# Patient Record
Sex: Male | Born: 1990 | Race: White | Hispanic: No | Marital: Married | Smoking: Never smoker
Health system: Southern US, Community
[De-identification: ages and names within clinical notes are randomized; demographics above are authoritative.]

---

## 2014-08-25 ENCOUNTER — Other Ambulatory Visit: Payer: Self-pay | Admitting: Occupational Medicine

## 2014-08-25 ENCOUNTER — Ambulatory Visit: Payer: Self-pay

## 2014-08-25 DIAGNOSIS — M79642 Pain in left hand: Secondary | ICD-10-CM

## 2016-01-14 ENCOUNTER — Encounter (HOSPITAL_COMMUNITY): Payer: Self-pay | Admitting: *Deleted

## 2016-01-14 ENCOUNTER — Emergency Department (HOSPITAL_COMMUNITY)
Admission: EM | Admit: 2016-01-14 | Discharge: 2016-01-14 | Disposition: A | Discharge status: Home / Self Care | Payer: Worker's Compensation | Attending: Emergency Medicine | Admitting: Emergency Medicine

## 2016-01-14 ENCOUNTER — Emergency Department (HOSPITAL_COMMUNITY): Payer: Worker's Compensation

## 2016-01-14 DIAGNOSIS — Y9389 Activity, other specified: Secondary | ICD-10-CM | POA: Insufficient documentation

## 2016-01-14 DIAGNOSIS — M25562 Pain in left knee: Secondary | ICD-10-CM | POA: Diagnosis not present

## 2016-01-14 DIAGNOSIS — M25522 Pain in left elbow: Secondary | ICD-10-CM

## 2016-01-14 DIAGNOSIS — Y92410 Unspecified street and highway as the place of occurrence of the external cause: Secondary | ICD-10-CM | POA: Insufficient documentation

## 2016-01-14 DIAGNOSIS — S59902A Unspecified injury of left elbow, initial encounter: Secondary | ICD-10-CM | POA: Insufficient documentation

## 2016-01-14 DIAGNOSIS — S0990XA Unspecified injury of head, initial encounter: Secondary | ICD-10-CM | POA: Insufficient documentation

## 2016-01-14 DIAGNOSIS — Y998 Other external cause status: Secondary | ICD-10-CM | POA: Insufficient documentation

## 2016-01-14 MED ORDER — IBUPROFEN 600 MG PO TABS: 600.0000 mg | ORAL_TABLET | Freq: Four times a day (QID) | ORAL | Status: AC | PRN

## 2016-01-14 MED ORDER — METHOCARBAMOL 500 MG PO TABS: 500.0000 mg | ORAL_TABLET | Freq: Two times a day (BID) | ORAL | Status: AC
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
## 2016-01-14 NOTE — ED Notes (Signed)
Patient transported to X-ray 

## 2016-01-14 NOTE — Discharge Instructions (Signed)
1. Medications: motrin, robaxin, usual home medications 2. Treatment: rest, drink plenty of fluids 3. Follow Up: please followup with your primary doctor for discussion of your diagnoses and further evaluation after today's visit; if you do not have a primary care doctor use the resource guide provided to find one; please return to the ER for increased pain, numbness, weakness, new or worsening symptoms   Musculoskeletal Pain Musculoskeletal pain is muscle and boney aches and pains. These pains can occur in any part of the body. Your caregiver may treat you without knowing the cause of the pain. They may treat you if blood or urine tests, X-rays, and other tests were normal.  CAUSES There is often not a definite cause or reason for these pains. These pains may be caused by a type of germ (virus). The discomfort may also come from overuse. Overuse includes working out too hard when your body is not fit. Boney aches also come from weather changes. Bone is sensitive to atmospheric pressure changes. HOME CARE INSTRUCTIONS   Ask when your test results will be ready. Make sure you get your test results.  Only take over-the-counter or prescription medicines for pain, discomfort, or fever as directed by your caregiver. If you were given medications for your condition, do not drive, operate machinery or power tools, or sign legal documents for 24 hours. Do not drink alcohol. Do not take sleeping pills or other medications that may interfere with treatment.  Continue all activities unless the activities cause more pain. When the pain lessens, slowly resume normal activities. Gradually increase the intensity and duration of the activities or exercise.  During periods of severe pain, bed rest may be helpful. Lay or sit in any position that is comfortable.  Putting ice on the injured area.  Put ice in a bag.  Place a towel between your skin and the bag.  Leave the ice on for 15 to 20 minutes, 3 to 4  times a day.  Follow up with your caregiver for continued problems and no reason can be found for the pain. If the pain becomes worse or does not go away, it may be necessary to repeat tests or do additional testing. Your caregiver may need to look further for a possible cause. SEEK IMMEDIATE MEDICAL CARE IF:  You have pain that is getting worse and is not relieved by medications.  You develop chest pain that is associated with shortness or breath, sweating, feeling sick to your stomach (nauseous), or throw up (vomit).  Your pain becomes localized to the abdomen.  You develop any new symptoms that seem different or that concern you. MAKE SURE YOU:   Understand these instructions.  Will watch your condition.  Will get help right away if you are not doing well or get worse.   This information is not intended to replace advice given to you by your health care provider. Make sure you discuss any questions you have with your health care provider.   Document Released: 11/24/2005 Document Revised: 02/16/2012 Document Reviewed: 07/29/2013 Elsevier Interactive Patient Education 2016 [employer].   Emergency Department Resource Guide 1) Find a Doctor and Pay Out of Pocket Although you won't have to find out who is covered by your insurance plan, it is a good idea to ask around and get recommendations. You will then need to call the office and see if the doctor you have chosen will accept you as a new patient and what types of options they offer for  patients who are self-pay. Some doctors offer discounts or will set up payment plans for their patients who do not have insurance, but you will need to ask so you aren't surprised when you get to your appointment.  2) Contact Your Local Health Department Not all health departments have doctors that can see patients for sick visits, but many do, so it is worth a call to see if yours does. If you don't know where your local health department is, you  can check in your phone book. The CDC also has a tool to help you locate your state's health department, and many state websites also have listings of all of their local health departments.  3) Find a Walk-in Clinic If your illness is not likely to be very severe or complicated, you may want to try a walk in clinic. These are popping up all over the country in pharmacies, drugstores, and shopping centers. They're usually staffed by nurse practitioners or physician assistants that have been trained to treat common illnesses and complaints. They're usually fairly quick and inexpensive. However, if you have serious medical issues or chronic medical problems, these are probably not your best option.  No Primary Care Doctor: - Call Health Connect at  [phone] - they can help you locate a primary care doctor that  accepts your insurance, provides certain services, etc. - Physician Referral Service- [phone]  Chronic Pain Problems: Organization         Address  Phone   Notes  [facility]  [phone] Patients need to be referred by their primary care doctor.   Medication Assistance: Organization         Address  Phone   Notes  [facility] Medication [facility] [address] [place], [place] [address] [phone] --Must be a resident of [facility] -- Must have NO insurance coverage whatsoever (no Medicaid/ Medicare, etc.) -- The pt. MUST have a primary care doctor that directs their care regularly and follows them in the community   [facility]  [phone]   [employer]  [phone]    Agencies that provide inexpensive medical care: Organization         Address  Phone   Notes  [facility]  [phone]   [facility]    [phone]   [facility] [address] [place], [place] [address] [phone]   [facility] [address] [place], [place] [phone]   Planned Parenthood    [phone]   [facility]    [phone]   [facility]  [address], [place] Phone:  [phone], Fax:  [phone] Hours of Operation:  9 am - 6 pm, M-F.  Also accepts Medicaid/Medicare and self-pay.  [facility]  [address], [place] Phone: [phone], Fax: [phone]. Hours of Operation:  8:30 am - 5:30 pm, M-F.  Also accepts Medicaid and self-pay.  [facility] [place] [address], [place] Phone: [phone]   [facility] [address] Natasha Bence [place], [place] [phone], Ext. [phone] Mondays & Thursdays: 7-9 AM.  First 15 patients are seen on a first come, first serve basis.    Medicaid-accepting [facility] Providers:  Organization         Address  Phone   Notes  [facility] [address], [place] [phone] Also accepts self-pay patients.  [facility] [address] Laurell Josephs [place], [place]  [phone]   [facility] [address], [place] [phone]   [facility] [address] [place] [phone]   Renaye Rakers [address], [place]   [phone] Only accepts [place] Access [place] patients after they have their name applied to their card.   Self-Pay (no insurance) in [facility] [place] [facility]:  Organization         Address  Phone   Notes  Sickle Cell Patients, [facility] [address] [place], [place] [phone]   [facility] [address] [place], [place] [phone]   [facility] [place]  [address], [place] [phone]   [facility]/Dr. Osei-Bonsu  [address] [place] or [address], [place] [phone] Phone number for both [place] and [place] locations  is the same.  Urgent Medical and [facility] [address] [place] [phone]   [facility] [address] [place] or [address] [phone]   [facility] [address], [place] [phone], phone; [phone], fax Sees patients 1st and 3rd Saturday of every month.  Must not qualify for public or private insurance (i.e. Medicaid, Medicare, [place] Health Choice, Veterans' Benefits)  Household income should be no more than 200% of the poverty level The clinic cannot treat you if you are pregnant or think you are pregnant  Sexually transmitted diseases are not treated at the clinic.    Dental Care: Organization         Address  Phone  Notes  [facility] Department of [facility] [address] [place], [place] [phone] Accepts children up to age 27 who are enrolled in [place] or [place] Health Choice; pregnant women with a Medicaid card; and children who have applied for Medicaid or [place] Health Choice, but were declined, whose parents can pay a reduced fee at time of service.  [facility] Department of [facility]  [address], [place] [phone] Accepts children up to age 96 who are enrolled in [place] or [place] Health Choice; pregnant women with a Medicaid card; and children who have applied for Medicaid or [place] Health Choice, but were declined, whose parents can pay a reduced fee at time of service.  [facility]  [address] [place], [place] [phone] Patients are seen by appointment only. Walk-ins are not accepted. [facility] will see patients 34 years of age and older. Monday - Tuesday (8am-5pm) Most Wednesdays (8:30-5pm) $30 per visit, cash only  [facility] Adult Dental Access PROGRAM  [address], [facility] [phone] Patients are seen by appointment only. Walk-ins are not accepted. [facility] will see  patients 36 years of age and older. One Wednesday Evening (Monthly: Volunteer Based).  $30 per visit, cash only  [employer]  [phone] for adults; Children under age 73, call Graduate Pediatric Dentistry at [phone]. Children aged 44-14, please call [phone] to request a pediatric application.  Dental services are provided in all areas of dental care including fillings, crowns and bridges, complete and partial dentures, implants, gum treatment,  root canals, and extractions. Preventive care is also provided. Treatment is provided to both adults and children. Patients are selected via a lottery and there is often a waiting list.   [facility] [address], [place]  [phone] [email or web address]   [facility] [address] [place], [place] [phone], Ext. [phone] Second and Fourth Thursday of each month, opens at 6:30 AM; Clinic ends at 9 AM.  Patients are seen on a first-come first-served basis, and a limited number are seen during each clinic.   [facility]  [address] Hillard Danker [place], [place] [phone]   Eligibility Requirements You must have lived in [place], [place], or [place] counties for at least the last three months.   You cannot be eligible for state or federal sponsored [employer], including [employer], [place], or [employer].   You generally cannot be eligible for healthcare insurance through your employer.    How to apply: Eligibility screenings are held every Tuesday and Wednesday afternoon from 1:00 pm until 4:00 pm. You do not need an appointment for the interview!  [facility] [address] [place], [place]   [place]  [place] Department  [place]  [phone]    Behavioral Health Resources in the Community: Intensive Outpatient  Programs Organization         Address  Phone  Notes  [place]. [address] [place], [place] [phone]   [facility] Outpatient [address] [place], [place]   ADS: [facility] [address] [place], [place]   [place] [address],  [place], [place] or [phone]   Substance Abuse Resources Organization         Address  Phone  Notes  Alcohol [facility] Drug Services  [phone]   [place]  [phone]   [facility] [place]   Chinita Pester  [phone]   Residential & Outpatient Substance Abuse Program  [phone]   Psychological Services Organization         Address  Phone  Notes  [facility]  [place]  [phone]   [place] [address], [place] or [phone]    Mobile Crisis Teams Organization         Address  Phone  Notes  [facility], Mobile Crisis Care Unit  [phone]   [facility]  [address] [place], [place]   [place] [address] [place] [phone]    Self-Help/Support Groups Organization         Address  Phone             Notes  [place]. of [place] - variety of support groups  [place] Call for more information  Narcotics Anonymous (NA), [facility] [address], [employer] [place]  2 meetings at this location   Special educational needs teacher         Address  Phone  Notes  [facility]idential [facility] [place],    [place]  [place]  [address] [place], [place], [place]   [place], [place] [phone] Admissions: 8am-3pm M-F  Incentives Substance [place]  [address],    [place], [place]  [phone]   [facility] [address] [place], [place], [place]   [facility] [address],  [place], [place]   [facility] - Intensive Outpatient [address] Kristeen [address], [place], [place]   [facility] [place].,  [place], [place] [phone] or [phone]   [facility] ([facility]) [address], [place], [place] Accepts Medicaid  [facility] [place] [address]  [place] [phone] Substance Abuse/Addiction Treatment   [facility] Organization         Address  Phone  Notes  [facility]  [phone]   Domenic Schwab, PhD [address] Arlis Porta [place], [place]   [phone] or [phone]   [place], [place] [phone]   [facility] [address], [place], [place] [phone] Insurance/Medicaid/sponsorship through [facility] and Families [address], [address]                                    [place], [place] [phone] [place] [address] [place] [address], [place] [phone]    Dr. Adele Schilder  [phone]   [facility] Dept. 1) [address] [place] 2) [place] 3)  [place] [address], [place] [phone]  [phone]   [place] [phone] or [phone] (After Hours)

## 2016-01-14 NOTE — ED Notes (Signed)
Declined W/C at D/C and was escorted to lobby by RN. 
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
## 2023-06-19 ENCOUNTER — Emergency Department (HOSPITAL_COMMUNITY): Payer: Managed Care, Other (non HMO)

## 2023-06-19 ENCOUNTER — Other Ambulatory Visit: Payer: Self-pay

## 2023-06-19 ENCOUNTER — Encounter (HOSPITAL_COMMUNITY): Payer: Self-pay | Admitting: *Deleted

## 2023-06-19 ENCOUNTER — Emergency Department (HOSPITAL_COMMUNITY)
Admission: EM | Admit: 2023-06-19 | Discharge: 2023-06-20 | Disposition: A | Discharge status: Home / Self Care | Payer: Managed Care, Other (non HMO) | Attending: Emergency Medicine | Admitting: Emergency Medicine

## 2023-06-19 DIAGNOSIS — R079 Chest pain, unspecified: Secondary | ICD-10-CM | POA: Diagnosis not present

## 2023-06-19 DIAGNOSIS — I10 Essential (primary) hypertension: Secondary | ICD-10-CM | POA: Diagnosis not present

## 2023-06-19 LAB — TROPONIN I (HIGH SENSITIVITY)
Troponin I (High Sensitivity): 4 ng/L (ref <18)
Troponin I (High Sensitivity): 4 ng/L (ref <18)

## 2023-06-19 LAB — CBC
HCT: 51.1 % (ref 39.0–52.0)
Hemoglobin: 16.7 g/dL (ref 13.0–17.0)
MCH: 28.6 pg (ref 26.0–34.0)
MCHC: 32.7 g/dL (ref 30.0–36.0)
MCV: 87.7 fL (ref 80.0–100.0)
Platelets: 264 10*3/uL (ref 150–400)
RBC: 5.83 MIL/uL — ABNORMAL HIGH (ref 4.22–5.81)
RDW: 12.7 % (ref 11.5–15.5)
WBC: 8.9 10*3/uL (ref 4.0–10.5)
nRBC: 0 % (ref 0.0–0.2)

## 2023-06-19 LAB — BASIC METABOLIC PANEL
Anion gap: 11 (ref 5–15)
BUN: 19 mg/dL (ref 6–20)
CO2: 25 mmol/L (ref 22–32)
Calcium: 9.6 mg/dL (ref 8.9–10.3)
Chloride: 104 mmol/L (ref 98–111)
Creatinine, Ser: 1.05 mg/dL (ref 0.61–1.24)
GFR, Estimated: >60 mL/min (ref >60)
Glucose, Bld: 99 mg/dL (ref 70–99)
Potassium: 3.6 mmol/L (ref 3.5–5.1)
Sodium: 140 mmol/L (ref 135–145)

## 2023-06-19 NOTE — ED Triage Notes (Signed)
The pt is c/o chest tightness  since yesterday no other symptoms.  Hx of the same no cardiac problems found

## 2023-06-20 MED ORDER — AMLODIPINE BESYLATE 5 MG PO TABS: 5.0000 mg | ORAL_TABLET | Freq: Every day | ORAL | 1 refills | Status: AC

## 2023-06-20 NOTE — Discharge Instructions (Signed)
You were evaluated in the Emergency Department and after careful evaluation, we did not find any emergent condition requiring admission or further testing in the hospital.  Your exam/testing today is overall reassuring.  No signs of heart damage or heart strain.  Suspect your pain is related to a muscle strain or spasm of some kind.  Can use Tylenol or Motrin for discomfort.  Regarding your high blood pressure, recommend continued follow-up with the primary care doctor.  You can start the amlodipine medicine daily and keep a close eye on your pressures at home.  Please return to the Emergency Department if you experience any worsening of your condition.   Thank you for allowing [place] to be a part of your care.

## 2023-06-20 NOTE — ED Provider Notes (Signed)
[facility] Emergency Department Provider Note MRN:  [id number]  Arrival date & time: 06/20/23     Chief Complaint   Chest Pain   History of Present Illness   Carlos Mcdonald is a 32 y.o. year-old male with no pertinent past medical history presenting to the ED with chief complaint of chest pain.  Patient explains he has been having mild high blood pressures for the past couple years, his primary care doctor does not really want to start him on medication.  Blood pressures in the 130s to 140s over 90s fairly consistently.  He is getting more and more worried that he is not medicated, has been doing some reading that high blood pressure is the silent killer.  A couple days ago he began having some pressure-like pain in the center of the chest, occurs with certain movements or positions but sometimes is happening at rest.  No associated symptoms, no dizziness or diaphoresis, no nausea vomiting, no trouble breathing, no recent leg pain or swelling, no history of heart disease, no history of blood clots.  Review of Systems  A thorough review of systems was obtained and all systems are negative except as noted in the HPI and PMH.   Patient's Health History   History reviewed. No pertinent past medical history.  History reviewed. No pertinent surgical history.  History reviewed. No pertinent family history.  Social History   Socioeconomic History   Marital status: Married    Spouse name: Not on file   Number of children: Not on file   Years of education: Not on file   Highest education level: Not on file  Occupational History   Not on file  Tobacco Use   Smoking status: Never   Smokeless tobacco: Not on file  Substance and Sexual Activity   Alcohol use: No   Drug use: No   Sexual activity: Not on file  Other Topics Concern   Not on file  Social History Narrative   Not on file   Social Determinants of Health   Financial Resource Strain: Not on file   Food Insecurity: Not on file  Transportation Needs: Not on file  Physical Activity: Not on file  Stress: Not on file  Social Connections: Not on file  Intimate Partner Violence: Not on file     Physical Exam   Vitals:   06/19/23 1845 06/19/23 2309  BP: (!) 152/98 135/86  Pulse: 86 60  Resp: 14 16  Temp: 98.8 F (37.1 C) 98.6 F (37 C)  SpO2: 99% 98%    CONSTITUTIONAL: Well-appearing, NAD NEURO/PSYCH:  Alert and oriented x 3, no focal deficits EYES:  eyes equal and reactive ENT/NECK:  no LAD, no JVD CARDIO: Regular rate, well-perfused, normal S1 and S2 PULM:  CTAB no wheezing or rhonchi GI/GU:  non-distended, non-tender MSK/SPINE:  No gross deformities, no edema SKIN:  no rash, atraumatic   *Additional and/or pertinent findings included in MDM below  Diagnostic and Interventional Summary    EKG Interpretation Date/Time:  Friday June 19 2023 18:46:44 EDT Ventricular Rate:  74 PR Interval:  168 QRS Duration:  92 QT Interval:  366 QTC Calculation: 406 R Axis:   69  Text Interpretation: Normal sinus rhythm Normal ECG No previous ECGs available Confirmed by Kennis Carina [id number]) on 06/19/2023 11:18:59 PM       Labs Reviewed  CBC - Abnormal; Notable for the following components:      Result Value   RBC 5.83 (*)  All other components within normal limits  BASIC METABOLIC PANEL  TROPONIN I (HIGH SENSITIVITY)  TROPONIN I (HIGH SENSITIVITY)    DG Chest 2 View  Final Result      Medications - No data to display   Procedures  /  Critical Care Procedures  ED Course and Medical Decision Making  Initial Impression and Ddx Patient has basically no cardiovascular risk factors, does not smoke, no concerning family history.  Description seems consistent with MSK related pain.  Highly doubt PE.  Patient has been having some consistent mild high blood pressures over the past couple years and he is very interested in starting an antihypertensive.  We discussed the  pros and cons of this and he would like to start a medicine.  Past medical/surgical history that increases complexity of ED encounter: None  Interpretation of Diagnostics I personally reviewed the EKG and my interpretation is as follows: Sinus rhythm without concerning ischemic features  Labs reassuring with no significant blood count or electrolyte disturbance.  Chest x-ray normal, troponin negative x 2.  Patient Reassessment and Ultimate Disposition/Management     Discharge.  Patient management required discussion with the following services or consulting groups:  None  Complexity of Problems Addressed Acute illness or injury that poses threat of life of bodily function  Additional Data Reviewed and Analyzed Further history obtained from: Further history from spouse/family member  Additional Factors Impacting ED Encounter Risk Prescriptions  Elmer Sow. Pilar Plate, MD [facility] Emergency Medicine [facility] [email or web address]  Final Clinical Impressions(s) / ED Diagnoses     ICD-10-CM   1. Chest pain, unspecified type  R07.9     2. Hypertension, unspecified type  I10       ED Discharge Orders          Ordered    amLODipine (NORVASC) 5 MG tablet  Daily        06/20/23 0011             Discharge Instructions Discussed with and Provided to Patient:    Discharge Instructions      You were evaluated in the Emergency Department and after careful evaluation, we did not find any emergent condition requiring admission or further testing in the hospital.  Your exam/testing today is overall reassuring.  No signs of heart damage or heart strain.  Suspect your pain is related to a muscle strain or spasm of some kind.  Can use Tylenol or Motrin for discomfort.  Regarding your high blood pressure, recommend continued follow-up with the primary care doctor.  You can start the amlodipine medicine daily and keep a close eye on your pressures at home.  Please  return to the Emergency Department if you experience any worsening of your condition.   Thank you for allowing [place] to be a part of your care.      Sabas Sous, MD 06/20/23 [phone]
